# Patient Record
Sex: Male | Born: 1950 | Race: White | Hispanic: No | Marital: Married | Smoking: Former smoker
Health system: Southern US, Community
[De-identification: ages and names within clinical notes are randomized; demographics above are authoritative.]

---

## 2000-08-12 ENCOUNTER — Emergency Department (HOSPITAL_COMMUNITY): Admission: EM | Admit: 2000-08-12 | Discharge: 2000-08-13 | Payer: Self-pay | Admitting: Emergency Medicine

## 2002-11-16 ENCOUNTER — Encounter: Payer: Self-pay | Admitting: Orthopedic Surgery

## 2002-11-16 ENCOUNTER — Encounter: Admission: RE | Admit: 2002-11-16 | Discharge: 2002-11-16 | Payer: Self-pay | Admitting: Orthopedic Surgery

## 2003-05-25 ENCOUNTER — Ambulatory Visit (HOSPITAL_COMMUNITY): Admission: RE | Admit: 2003-05-25 | Discharge: 2003-05-25 | Payer: Self-pay | Admitting: Gastroenterology

## 2003-05-25 ENCOUNTER — Encounter (INDEPENDENT_AMBULATORY_CARE_PROVIDER_SITE_OTHER): Payer: Self-pay | Admitting: *Deleted

## 2004-02-06 ENCOUNTER — Encounter: Admission: RE | Admit: 2004-02-06 | Discharge: 2004-02-06 | Payer: Self-pay | Admitting: Orthopedic Surgery

## 2005-08-28 ENCOUNTER — Encounter: Admission: RE | Admit: 2005-08-28 | Discharge: 2005-08-28 | Payer: Self-pay | Admitting: Orthopedic Surgery

## 2005-09-15 ENCOUNTER — Encounter: Admission: RE | Admit: 2005-09-15 | Discharge: 2005-09-15 | Payer: Self-pay | Admitting: Pediatrics

## 2005-10-26 ENCOUNTER — Ambulatory Visit (HOSPITAL_BASED_OUTPATIENT_CLINIC_OR_DEPARTMENT_OTHER): Admission: RE | Admit: 2005-10-26 | Discharge: 2005-10-26 | Payer: Self-pay | Admitting: Orthopedic Surgery

## 2007-01-08 IMAGING — CT CT PARANASAL SINUSES LIMITED
1 series · 16 of 28 positions shown, 20 images · IV contrast (agent unspecified)
Comparison: none

CLINICAL DATA: Frontal and temporal pain, drainage, and pressure. 
 LIMITED CT SINUS W/O CONTRAST:
 Limited scans through the paranasal sinuses were performed in the prone coronal position.  The paranasal sinuses are well pneumatized with no present evidence of sinusitis.  The nasal turbinates are normal in size and position and the nasal airway is patent.  No bony abnormality is seen.

[Series 2: limited sinus · axial · 0.33mm/px · z∈[+24,+125]mm · 16 of 28 slices shown, 20 images]
[im 2/28  brain]
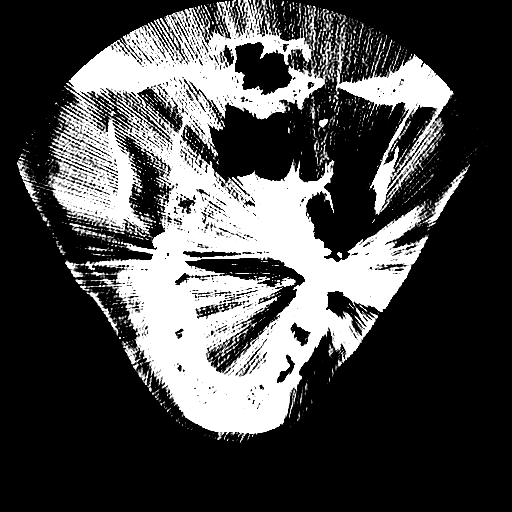
[im 2/28  bone]
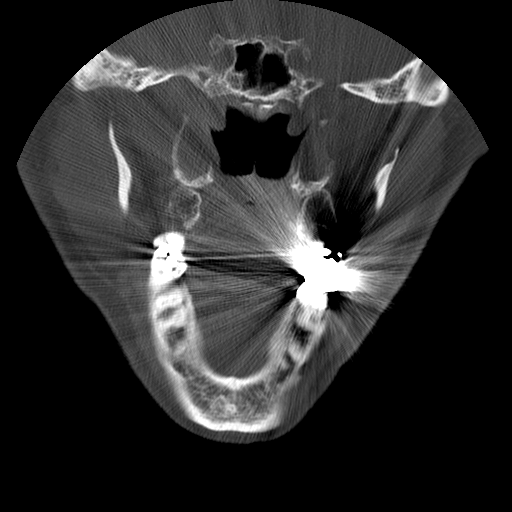
[im 4/28  bone]
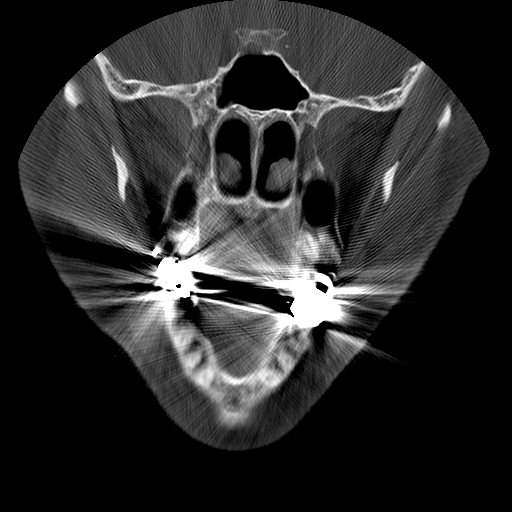
[im 6/28  bone]
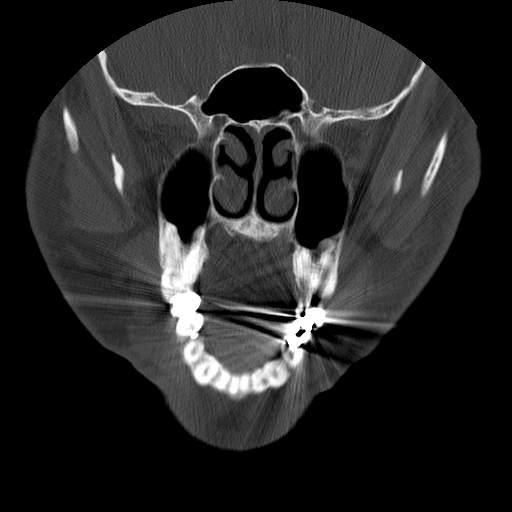
[im 7/28  bone]
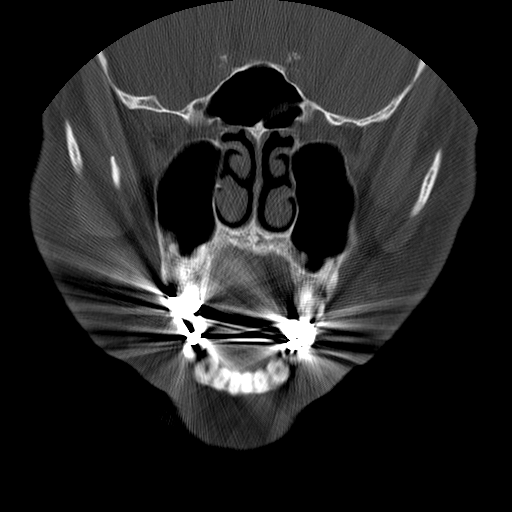
[im 9/28  brain]
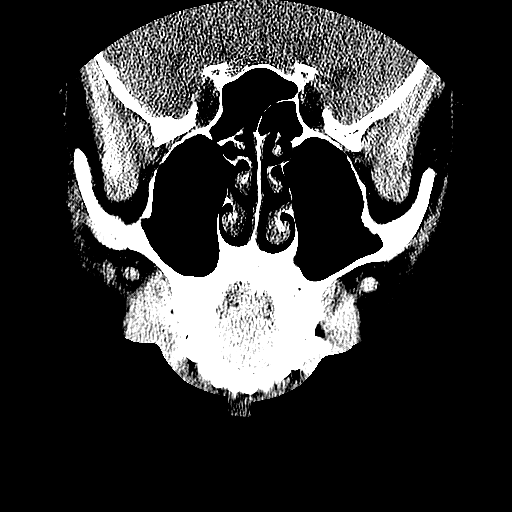
[im 9/28  bone]
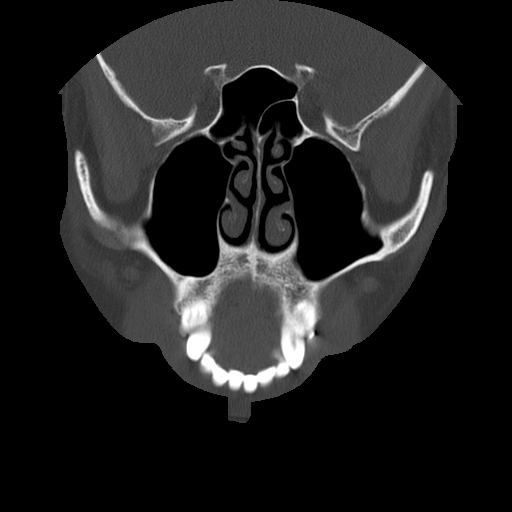
[im 10/28  bone]
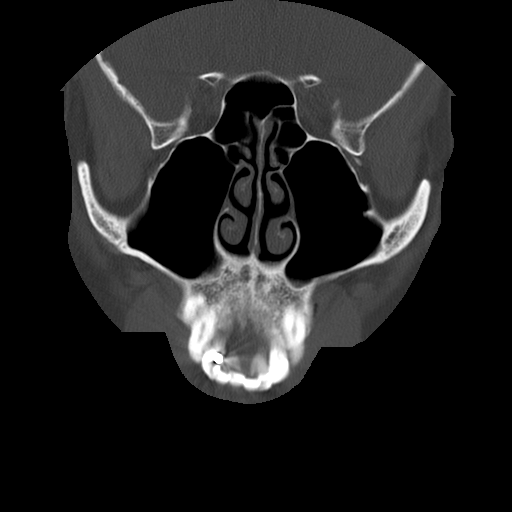
[im 12/28  bone]
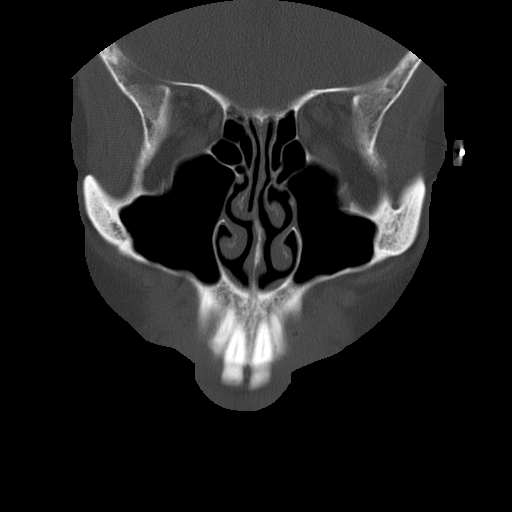
[im 14/28  bone]
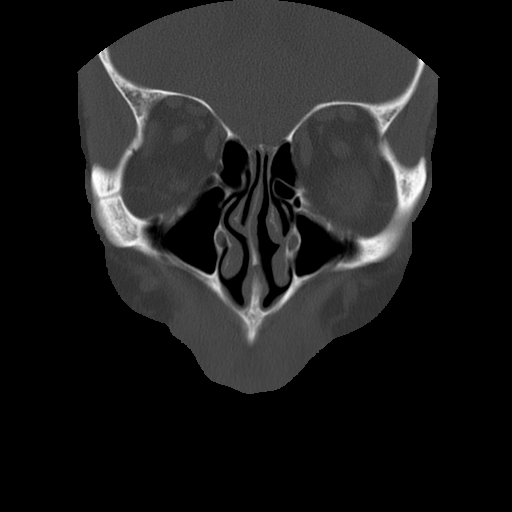
[im 15/28  brain]
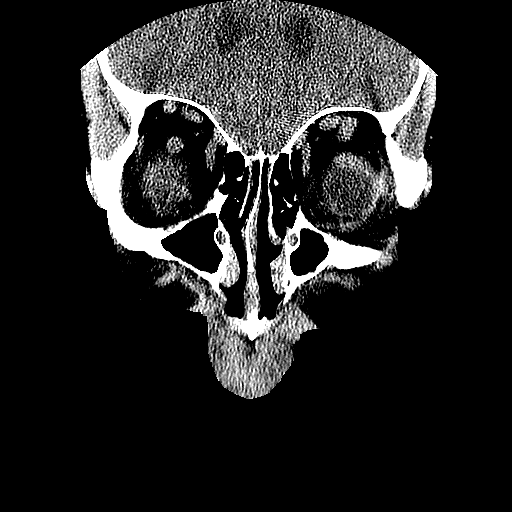
[im 15/28  bone]
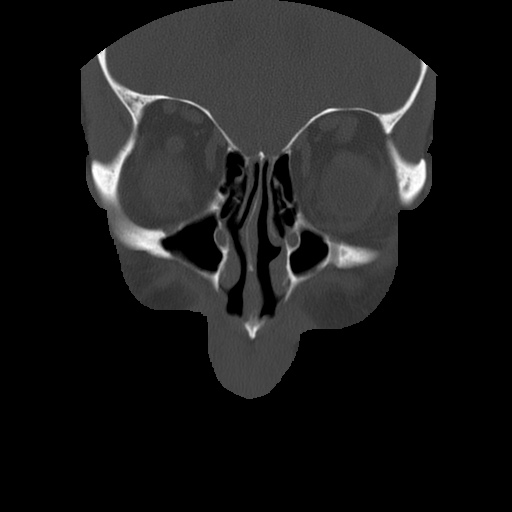
[im 17/28  bone]
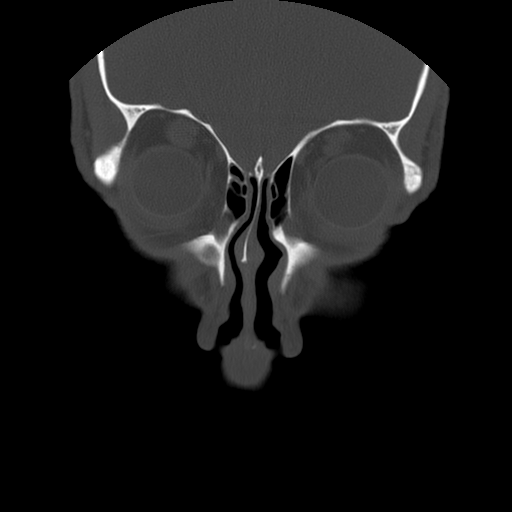
[im 19/28  bone]
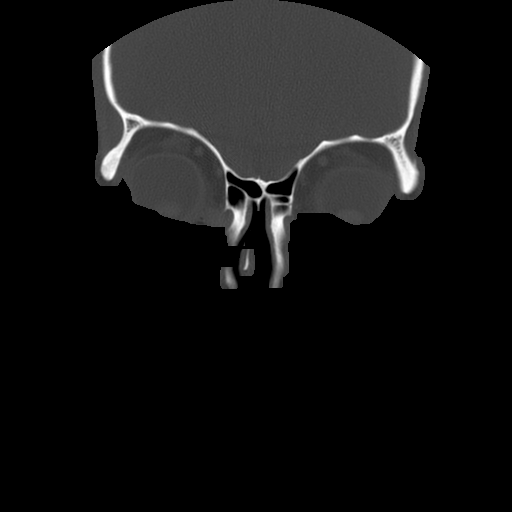
[im 20/28  bone]
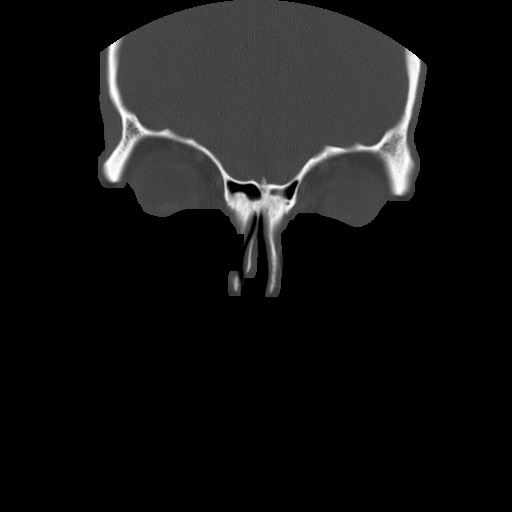
[im 22/28  brain]
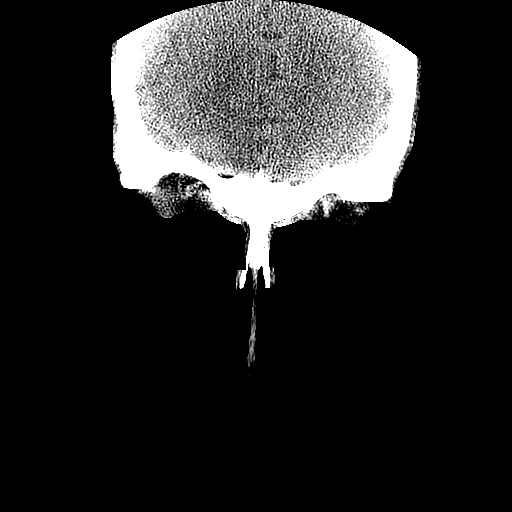
[im 22/28  bone]
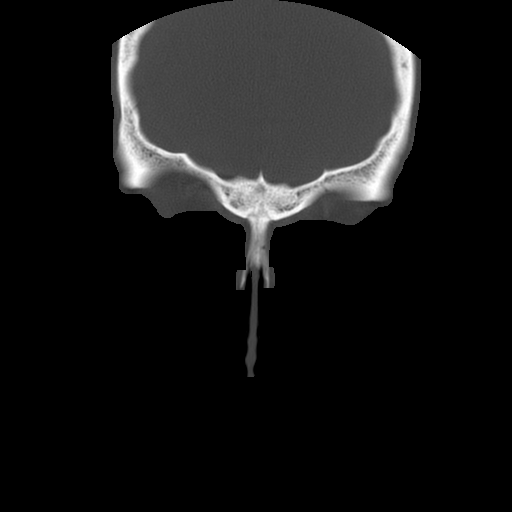
[im 23/28  bone]
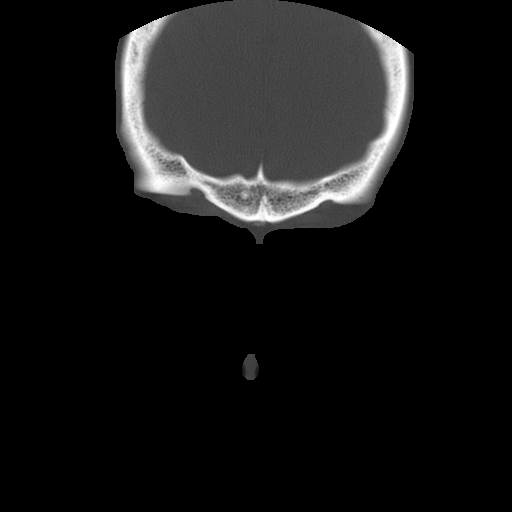
[im 25/28  bone]
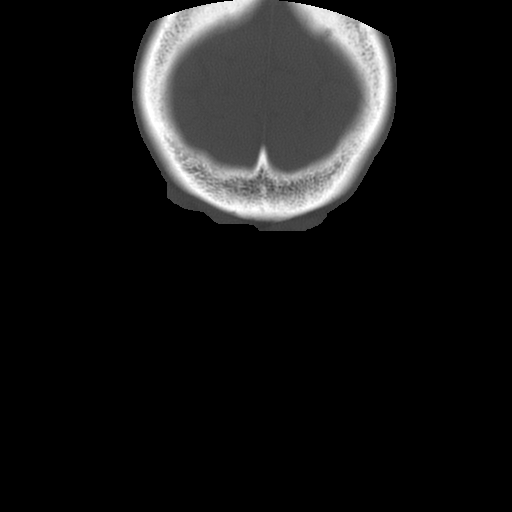
[im 27/28  bone]
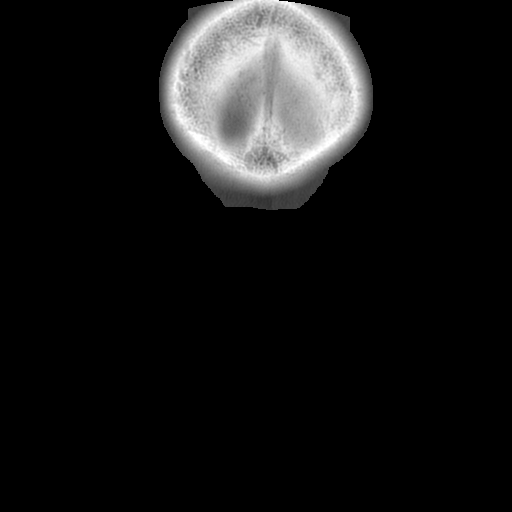

[16 of 28 positions shown; findings below may reference images not displayed]

IMPRESSION: No evidence of sinusitis.

## 2007-02-02 ENCOUNTER — Ambulatory Visit (HOSPITAL_BASED_OUTPATIENT_CLINIC_OR_DEPARTMENT_OTHER): Admission: RE | Admit: 2007-02-02 | Discharge: 2007-02-02 | Payer: Self-pay | Admitting: Pediatrics

## 2007-02-06 ENCOUNTER — Ambulatory Visit: Payer: Self-pay | Admitting: Internal Medicine

## 2007-04-14 ENCOUNTER — Ambulatory Visit: Payer: Self-pay | Admitting: Internal Medicine

## 2007-04-27 ENCOUNTER — Ambulatory Visit (HOSPITAL_COMMUNITY): Admission: RE | Admit: 2007-04-27 | Discharge: 2007-04-27 | Payer: Self-pay | Admitting: Internal Medicine

## 2007-04-27 ENCOUNTER — Ambulatory Visit: Payer: Self-pay | Admitting: Internal Medicine

## 2007-05-06 ENCOUNTER — Ambulatory Visit: Payer: Self-pay | Admitting: Internal Medicine

## 2007-06-14 ENCOUNTER — Ambulatory Visit: Payer: Self-pay | Admitting: Internal Medicine

## 2010-04-17 ENCOUNTER — Encounter: Payer: Self-pay | Admitting: Gastroenterology

## 2010-10-22 ENCOUNTER — Ambulatory Visit (HOSPITAL_COMMUNITY)
Admission: RE | Admit: 2010-10-22 | Discharge: 2010-10-22 | Payer: Self-pay | Source: Home / Self Care | Attending: Gastroenterology | Admitting: Gastroenterology

## 2010-11-04 NOTE — Letter (Signed)
Summary: New Patient letter  [facility]  [address] [place], [place] [address]   Phone: [phone]  Fax: [phone]       04/17/2010 MRN: [id number]  Brian Kent [address] [place], [place]  [address]  Dear Brian Kent,  Welcome to the Gastroenterology Division at [facility].    You are scheduled to see Dr.   Jarold Motto on 05-29-10 at [facility] on the 3rd floor at [facility], [address].  We ask that you try to arrive at our office 15 minutes prior to your appointment time to allow for check-in.  We would like you to complete the enclosed self-administered evaluation form prior to your visit and bring it with you on the day of your appointment.  We will review it with you.  Also, please bring a complete list of all your medications or, if you prefer, bring the medication bottles and we will list them.  Please bring your insurance card so that we may make a copy of it.  If your insurance requires a referral to see a specialist, please bring your referral form from your primary care physician.  Co-payments are due at the time of your visit and may be paid by cash, check or credit card.     Your office visit will consist of a consult with your physician (includes a physical exam), any laboratory testing he/she may order, scheduling of any necessary diagnostic testing (e.g. x-ray, ultrasound, CT-scan), and scheduling of a procedure (e.g. Endoscopy, Colonoscopy) if required.  Please allow enough time on your schedule to allow for any/all of these possibilities.    If you cannot keep your appointment, please call [phone] to cancel or reschedule prior to your appointment date.  This allows [place] the opportunity to schedule an appointment for another patient in need of care.  If you do not cancel or reschedule by 5 p.m. the business day prior to your appointment date, you will be charged a $50.00 late cancellation/no-show fee.    Thank you for choosing  [facility] for your medical needs.  We appreciate the opportunity to care for you.  Please visit [place] at our website  to learn more about our practice.                     Sincerely,                                                             The Gastroenterology Division

## 2011-02-17 NOTE — Assessment & Plan Note (Signed)
[facility]                             PULMONARY OFFICE NOTE   NAME:Brian Kent, Toren                     MRN:          [id number]  DATE:04/14/2007                            DOB:          09-26-1951    PULMONARY CONSULTATION   PROBLEM:  Pulmonary consultation for this 60 year old man at the kind  request of Dr. Beaulah Dinning with concerns of exertional dyspnea and sleep  apnea.   HISTORY:  Mr. Gupton is emphatic that his principal concern is with  daytime sleepiness, present constantly, but more apparent during  exertion.  He had been worked up by Dr. Beaulah Dinning saying that office  spirometries were normal.  He had allergen testing without vaccine  recommended.  He has been told he has asthma, but says that no pulmonary  medicines helped any, and the only medication that made any difference  was Ativan, which seemed to relieve his since of dyspnea.  A Cardiolite  study was normal.  The sense of daytime tiredness led to a nocturnal  polysomnogram on February 02, 2007, which showed normal sleep architecture  and mild obstructive sleep apnea with an index of 11.2 per hour, mild  snoring, and desaturation to 86%.  He also had mild periodic limb  movement with arousal at 2.4 per hour.  Occasionally, he is awakened  choking, and he suspects this may be partly an anxiety related to having  awakened once in the past with an anaphylactic reaction to ZANAFLEX.   MEDICATIONS:  1. Arthrotec 75 mg.  2. Allegra 180 mg.  3. Omeprazole 40 mg.  4. Nasonex.  Numerous medication and herbal supplements.   DRUG ALLERGIES:  ZANAFLEX with anaphylaxis.   REVIEW OF SYSTEMS:  Bedtime 10 to 11 p.m. estimating sleep latency 15  minutes and expecting to wake once or twice during the night before  final waking at 6 a.m.  Rare naps.  No irresistible daytime sleepiness.  Very occasional episodes of choking or gasping that wake him with little  snoring reported by wife.  Occasional  indigestion.  No chest pain or  palpitations, weight loss, or fever, sputum or wheeze, ankle edema or  leg pain.   PAST HISTORY:  Asthma.  Nasal congestion attributed to allergic  rhinitis.  Sleep apnea per HPI.  No surgeries.   SOCIAL HISTORY:  Contact allergy to ADHESIVE, questionably to LATEX.  No  problem with contrast dye or aspirin.  Quit smoking in 1980.  Married.   FAMILY HISTORY:  Father with emphysema.  Mother had allergic rhinitis  and asthma, and cancer.  Nobody in the family known to have sleep apnea.   OBJECTIVE:  Weight 243 pounds, BP 122/78, pulse 86, room air saturation  99%.  A very alert man giving the impression of mild apprehension or anxiety.  Nasal airway unobstructed.  Palate spacing 3/4 with posteriorly placed  thin soft palate.  Pharynx clear.  No stridor or thyromegaly.  Voice  quality normal.  CHEST:  Quite clear lung fields.  Unlabored breathing.  HEART:  Sounds are regular without murmur or gallop.  No enlargement of liver  or spleen.  EXTREMITIES:  Without cyanosis, clubbing, or edema, or tremor.   IMPRESSION:  1. Mild obstructive sleep apnea of doubtful connection to his concern      about a sense of dyspnea, especially during the day.  2. Tentative diagnosis of asthma, although he says he has had no      response to bronchodilator meds and spirometry scores were always      normal.  Note that he brings a note from his primary care      physician, Dr. Clelia Croft, asking about referral to Dr. Gala Romney for      cardiopulmonary stress test.  We discussed that, comparing it to      the Cardiolite study he had already had, and recognizing that his      personal concern is primarily with his sense of dyspnea with very      little associated objective finding.  I commented that relief by      Ativan could suggest this has a significant anxiety component or      occasionally could be explained by chest wall muscle tightness,      relaxed by Ativan.   PLAN:   1. We discussed CPAP, its medical concerns, and its treatment options.      We are going to titrate and try CPAP.  2. Schedule complete pulmonary function tests including measured lung      volumes and diffusion capacity with a 6-minute walk test.  3. Per suggestion of Dr. Clelia Croft, he is referred to cardiology, Dr.      Gala Romney for consideration of a cardiopulmonary stress test with      oxygen consumption evaluation because of complaint of dyspnea.      Schedule return in 2 months, earlier p.r.n.     Clinton D. Maple Hudson, MD, Tonny Bollman, FACP  Electronically Signed    CDY/MedQ  DD: 04/14/2007  DT: 04/15/2007  Job #: [id number]   cc:   Stephannie Li, M.D.  Kari Baars, M.D.  Francisca December, M.D.  Bevelyn Buckles. Bensimhon, MD

## 2011-02-17 NOTE — Assessment & Plan Note (Signed)
[facility]                             PULMONARY OFFICE NOTE   NAME:KARNESCastin, Donaghue                     MRN:          [id number]  DATE:06/14/2007                            DOB:          [birth year]    PROBLEM LIST:  1. Mild obstructive sleep apnea.  2. Dyspnea/asthma.   HISTORY OF PRESENT ILLNESS:  Sleep study had demonstrated an apnea  hypopnea index of 11 per hour.  Wife tells him he does not snore.  Cardiopulmonary exercise test in August has shown excellent  cardiovascular fitness with no obvious cardiopulmonary defect and  suggested his perception of exercise intolerance was due to his obesity  without evidence of exercise-induced bronchoconstriction.  Pulmonary  function testing August 1 had shown normal to supranormal values with  insignificant response to bronchodilator.  On a simple 6 minute walk  test he went 516 meters without stopping and maintained oxygen  saturation at 98 to 100% throughout.  Heart rate and blood pressure  response were normal.  He has no history of anemia.  He is still trying  CPAP, but has difficulty tolerating it.  We suggested that his apnea  scores were so low the CPAP was likely to be more disturbing than  helpful and that alternatives might suit him better.  We reviewed oral  appliances.   OBJECTIVE/PHYSICAL EXAMINATION:  VITAL SIGNS:  Weight 242 pounds, BP  120/70, pulse 75, room air saturation 100%.  NECK:  Neck veins distended 1 cm sitting.  CHEST:  Very clear.  He is somewhat overweight.  EXTREMITIES:  No edema.   IMPRESSION:  1. Complaint of dyspnea without objective basis by cardiopulmonary      stress test or standard pulmonary function test.  2. Obstructive sleep apnea not tolerating CPAP well, 11 per hour.   PLAN:  Weight loss.  Sleep hygiene education done.  Aerobic exercise  discussed with an emphasis on importance of losing weight.  We are  stopping his CPAP.  Contact information is given so  that he can look  into oral appliances for his sleep apnea.  Schedule return p.r.n.     Clinton D. Maple Hudson, MD, Tonny Bollman, FACP  Electronically Signed   CDY/MedQ  DD: 06/19/2007  DT: 06/20/2007  Job #: [id number]   cc:   Stephannie Li, M.D.  Kari Baars, M.D.  Francisca December, M.D.  Bevelyn Buckles. Bensimhon, MD

## 2011-02-20 NOTE — Procedures (Signed)
NAME:  Brian Kent, Brian Kent NO.:  [id number]   MEDICAL RECORD NO.:  [id number]          PATIENT TYPE:  OUT   LOCATION:  SLEEP CENTER                 FACILITY:  [facility]   PHYSICIAN:  Clinton D. Maple Hudson, MD, FCCP, FACPDATE OF BIRTH:  09[birth year]1950   DATE OF STUDY:  02/02/2007                            NOCTURNAL POLYSOMNOGRAM   REFERRING PHYSICIAN:  JOSE A BARDELAS   INDICATION FOR STUDY:  Hypersomnia with sleep apnea.   EPWORTH SLEEPINESS SCORE:  10/24, BMI 31.8, weight 235 pounds.   MEDICATIONS:  Home medications are listed and reviewed.   SLEEP ARCHITECTURE:  Total sleep time 376 minutes with sleep efficiency  93%.  Stage I was 4%, stage II 70%, stages III and IV 9%, REM 16% of  total sleep time.  Sleep latency 16 minutes, REM latency 73 minutes,  awake after sleep onset 16 minutes.  Arousal index 13.4.  No bedtime  medication was taken.   RESPIRATORY DATA:  Apnea/hypopnea index (AHI, RDI) 11.2 obstructive  events per hour indicating mild obstructive sleep apnea/hypopnea  syndrome.  There were 17 obstructive apneas and 53 hypopneas.  Events  were not positional.  REM AHI 3 per hour.  Diagnostic NPSG protocol was  requested and followed.   OXYGEN DATA:  Mild snoring with oxygen desaturation to a nadir of 86%.  Mean oxygen saturation through the study was 93% on room air.   CARDIAC DATA:  Sinus rhythm with occasional PVC.   MOVEMENT-PARASOMNIA:  A total of 27 limb jerks were recorded of which 15  were associated with arousal or awakening for a periodic limb movement  with arousal index of 2.4 per hour which is of doubtful significance.  No sudden events or choking were recorded.   IMPRESSIONS-RECOMMENDATIONS:  1. Unremarkable sleep architecture for age.  2. Mild obstructive sleep apnea/hypopnea syndrome, AHI 11.2 per hour      with nonpositional events.  Mild snoring and oxygen desaturation to      a nadir of 86%.  No choking episodes or acute respiratory  discomfort described.  3. Scores in this range may be appropriate for consideration of CPAP      titration as a therapeutic option if more      conservative measures are insufficient and symptoms are      sufficiently disruptive.  4. Mild periodic limb movement with arousal, 2.4 per hour, of doubtful      clinical significance.      Clinton D. Maple Hudson, MD, [facility], FACP  Diplomate, Biomedical engineer of Sleep Medicine  Electronically Signed     CDY/MEDQ  D:  02/06/2007 10:53:14  T:  02/06/2007 18:21:00  Job:  [id number]

## 2011-02-20 NOTE — Op Note (Signed)
NAME:  Brian Kent, Brian Kent              ACCOUNT NO.:  [id number]   MEDICAL RECORD NO.:  [id number]          PATIENT TYPE:  AMB   LOCATION:  DSC                          FACILITY:  [facility]   PHYSICIAN:  Robert A. Thurston Hole, M.D. DATE OF BIRTH:  04-25-1951   DATE OF PROCEDURE:  10/26/2005  DATE OF DISCHARGE:                                 OPERATIVE REPORT   PREOPERATIVE DIAGNOSIS:  Left knee medial and lateral meniscal tears with  chondromalacia and synovitis.   POSTOPERATIVE DIAGNOSIS:  Left knee medial and lateral meniscal tears with  chondromalacia and synovitis.   OPERATION PERFORMED:  1.  Left knee examination under anesthesia followed by arthroscopic partial      and lateral meniscectomies.  2.  Left knee chondroplasty with partial synovectomy.   SURGEON:  Elana Alm. Thurston Hole, M.D.   ANESTHESIA:  Local with MAC.   OPERATIVE TIME:  30 minutes.   COMPLICATIONS:  None.   INDICATIONS FOR PROCEDURE:  [name] is a 60 year old gentleman who has  had significant left knee pain for the past three months secondary to a  twisting injury.  Exam and MRI has revealed a medial and lateral meniscal  tear with chondromalacia and synovitis.  He has failed conservative care and  is now to undergo arthroscopy.   DESCRIPTION OF PROCEDURE:  [name] was brought to the operating room on  October 26, 2005 after a knee block was placed in the holding room by  Anesthesia. He was placed on the operating table in supine position.  His  left knee was examined under anesthesia. He had full range of motion and his  knee was stable to ligamentous exam with normal patellar tracking.  The left  leg was prepped using sterile DuraPrep and draped using sterile technique.  Originally through an anterolateral portal, the arthroscope with a pump  attached was placed through an anteromedial portal.  Arthroscopic probe was  placed.  On initial inspection of the medial compartment, the articular  cartilage showed 25%  grade 3 and the rest grade 1 to 2 chondromalacia which  was debrided, medial meniscus tear posteromedial horn of which 50% was  resected back to a stable rim. Intercondylar notch inspected.  The anterior  and posterior cruciate ligaments were normal.  The lateral compartment  inspected, 25% grade 3 chondromalacia which was debrided.  Lateral meniscal  tear, posterolateral and anterior horn of which 30 to 40% was resected back  to a stable rim.  Patellofemoral joint was inspected.  25 to 30% grade 3  chondromalacia which was debrided.  Patella tracked normally.  Medial and  lateral gutters showed moderate synovitis which was debrided.  Otherwise,  they were free of pathology.  After this was done, it was felt that all  pathology had been satisfactorily addressed.  The instruments were removed.  Portals were closed with 3-0 nylon suture and injected with 0.25% Marcaine  with epinephrine and 4 mg of morphine. Sterile dressings applied and the  patient awakened and taken to recovery room in stable condition.   FOLLOW-UP CARE:  [name] will be followed as an  outpatient on Vicodin and  Arthrotec.  See him back in the office in a week for suture removal and  follow-up.      Robert A. Thurston Hole, M.D.  Electronically Signed     RAW/MEDQ  D:  10/26/2005  T:  10/27/2005  Job:  [id number]

## 2014-07-16 ENCOUNTER — Other Ambulatory Visit: Payer: Self-pay | Admitting: Dermatology

## 2014-10-10 ENCOUNTER — Other Ambulatory Visit: Payer: Self-pay | Admitting: Gastroenterology

## 2014-10-10 DIAGNOSIS — R1013 Epigastric pain: Secondary | ICD-10-CM

## 2014-10-16 ENCOUNTER — Other Ambulatory Visit: Payer: Self-pay

## 2014-10-16 ENCOUNTER — Other Ambulatory Visit: Payer: Self-pay | Admitting: Gastroenterology

## 2014-10-18 ENCOUNTER — Ambulatory Visit
Admission: RE | Admit: 2014-10-18 | Discharge: 2014-10-18 | Disposition: A | Discharge status: Home / Self Care | Payer: Self-pay | Source: Ambulatory Visit | Attending: Gastroenterology | Admitting: Gastroenterology

## 2014-10-18 DIAGNOSIS — R1013 Epigastric pain: Secondary | ICD-10-CM

## 2015-04-01 ENCOUNTER — Other Ambulatory Visit: Payer: Self-pay

## 2018-01-17 ENCOUNTER — Encounter (HOSPITAL_COMMUNITY): Payer: Self-pay

## 2018-01-28 ENCOUNTER — Other Ambulatory Visit (HOSPITAL_COMMUNITY): Payer: Self-pay

## 2018-01-31 ENCOUNTER — Ambulatory Visit (HOSPITAL_COMMUNITY)
Admission: RE | Admit: 2018-01-31 | Discharge: 2018-01-31 | Disposition: A | Discharge status: Home / Self Care | Payer: Medicare Other | Source: Ambulatory Visit | Attending: Internal Medicine | Admitting: Internal Medicine

## 2018-01-31 DIAGNOSIS — M810 Age-related osteoporosis without current pathological fracture: Secondary | ICD-10-CM | POA: Insufficient documentation

## 2018-01-31 MED ORDER — ZOLEDRONIC ACID 5 MG/100ML IV SOLN
INTRAVENOUS | Status: AC
  Administered 2018-01-31: 5 mg via INTRAVENOUS
  Filled 2018-01-31: qty 100

## 2018-01-31 MED ORDER — ZOLEDRONIC ACID 5 MG/100ML IV SOLN
5.0000 mg | Freq: Once | INTRAVENOUS | Status: AC
  Administered 2018-01-31: 5 mg via INTRAVENOUS

## 2018-01-31 NOTE — Discharge Instructions (Signed)

## 2019-03-20 ENCOUNTER — Other Ambulatory Visit (HOSPITAL_COMMUNITY): Payer: Self-pay | Admitting: *Deleted

## 2019-03-21 ENCOUNTER — Other Ambulatory Visit: Payer: Self-pay

## 2019-03-21 ENCOUNTER — Ambulatory Visit (HOSPITAL_COMMUNITY)
Admission: RE | Admit: 2019-03-21 | Discharge: 2019-03-21 | Disposition: A | Discharge status: Home / Self Care | Payer: Medicare Other | Source: Ambulatory Visit | Attending: Internal Medicine | Admitting: Internal Medicine

## 2019-03-21 DIAGNOSIS — M810 Age-related osteoporosis without current pathological fracture: Secondary | ICD-10-CM | POA: Diagnosis not present

## 2019-03-21 MED ORDER — ZOLEDRONIC ACID 5 MG/100ML IV SOLN
INTRAVENOUS | Status: AC
  Administered 2019-03-21: 5 mg
  Filled 2019-03-21: qty 100

## 2019-03-21 MED ORDER — ZOLEDRONIC ACID 5 MG/100ML IV SOLN: 5.0000 mg | Freq: Once | INTRAVENOUS | Status: DC

## 2019-11-06 ENCOUNTER — Ambulatory Visit: Payer: Medicare Other

## 2019-11-12 ENCOUNTER — Ambulatory Visit: Payer: Medicare Other

## 2020-01-29 ENCOUNTER — Other Ambulatory Visit: Payer: Self-pay | Admitting: Anesthesiology

## 2020-01-29 DIAGNOSIS — M4316 Spondylolisthesis, lumbar region: Secondary | ICD-10-CM

## 2020-02-22 ENCOUNTER — Other Ambulatory Visit: Payer: Medicare Other

## 2020-02-26 ENCOUNTER — Ambulatory Visit
Admission: RE | Admit: 2020-02-26 | Discharge: 2020-02-26 | Disposition: A | Discharge status: Home / Self Care | Payer: Medicare Other | Source: Ambulatory Visit | Attending: Anesthesiology | Admitting: Anesthesiology

## 2020-02-26 ENCOUNTER — Other Ambulatory Visit: Payer: Self-pay

## 2020-02-26 DIAGNOSIS — M4316 Spondylolisthesis, lumbar region: Secondary | ICD-10-CM

## 2020-04-29 ENCOUNTER — Other Ambulatory Visit: Payer: Self-pay | Admitting: Podiatry

## 2020-04-29 ENCOUNTER — Other Ambulatory Visit: Payer: Self-pay

## 2020-04-29 ENCOUNTER — Ambulatory Visit (INDEPENDENT_AMBULATORY_CARE_PROVIDER_SITE_OTHER): Payer: Medicare Other

## 2020-04-29 ENCOUNTER — Ambulatory Visit (INDEPENDENT_AMBULATORY_CARE_PROVIDER_SITE_OTHER): Payer: Medicare Other | Admitting: Podiatry

## 2020-04-29 ENCOUNTER — Encounter: Payer: Self-pay | Admitting: Podiatry

## 2020-04-29 VITALS — Temp 97.3°F

## 2020-04-29 DIAGNOSIS — M21622 Bunionette of left foot: Secondary | ICD-10-CM | POA: Diagnosis not present

## 2020-04-29 DIAGNOSIS — L84 Corns and callosities: Secondary | ICD-10-CM | POA: Diagnosis not present

## 2020-04-29 DIAGNOSIS — M779 Enthesopathy, unspecified: Secondary | ICD-10-CM

## 2020-04-29 DIAGNOSIS — M21621 Bunionette of right foot: Secondary | ICD-10-CM | POA: Diagnosis not present

## 2020-04-29 NOTE — Progress Notes (Signed)
Patient ID: Brian Kent, male   DOB: [birth year], 69 y.o.   MRN: [id number]

## 2020-04-29 NOTE — Progress Notes (Signed)
Subjective:   Patient ID: Brian Kent, male   DOB: 69 y.o.   MRN: [id number]   HPI Patient presents stating he has had a lot of pain in the outside of his left foot and has been diagnosed with a tailor's bunion in the past.  States is very sore and makes it hard to walk and has tried Voltaren gel and different questions.  Patient does not smoke likes to be active   Review of Systems  All other systems reviewed and are negative.       Objective:  Physical Exam Vitals and nursing note reviewed.  Constitutional:      Appearance: He is well-developed.  Pulmonary:     Effort: Pulmonary effort is normal.  Musculoskeletal:        General: Normal range of motion.  Skin:    General: Skin is warm.  Neurological:     Mental Status: He is alert.     Neurovascular status intact muscle strength found to be adequate range of motion within normal limits.  Patient is noted to have plantar flexion of the fifth metatarsal left with fluid buildup and keratotic lesion that is painful when pressed with inability to walk comfortably on the outside of the left foot.  Patient has good digital perfusion well oriented x3     Assessment:  Inflammatory capsulitis fifth MPJ left with porokeratotic lesion painful when pressed     Plan:  H&P educated him on condition discussed possible surgical alternatives in future but at this point I went ahead and I did sterile prep and I injected around the fifth MPJ 3 mg dexamethasone Kenalog and I then went ahead debrided the lesion applied padding and advised on fifth metatarsal head resection if possible that he does not improve.  I also recommended continued gel usage and cushioning  X-rays indicate there is some prominence around the fifth metatarsal head left

## 2020-04-29 NOTE — Progress Notes (Signed)
° °  Subjective:    Patient ID: Brian Kent, male    DOB: 03/21/1951, 69 y.o.   MRN: [id number]  HPI    Review of Systems  All other systems reviewed and are negative.      Objective:   Physical Exam        Assessment & Plan:

## 2022-02-05 ENCOUNTER — Ambulatory Visit (INDEPENDENT_AMBULATORY_CARE_PROVIDER_SITE_OTHER): Payer: Medicare Other | Admitting: Podiatry

## 2022-02-05 ENCOUNTER — Encounter: Payer: Self-pay | Admitting: Podiatry

## 2022-02-05 DIAGNOSIS — M7752 Other enthesopathy of left foot: Secondary | ICD-10-CM

## 2022-02-05 MED ORDER — TRIAMCINOLONE ACETONIDE 10 MG/ML IJ SUSP
10.0000 mg | Freq: Once | INTRAMUSCULAR | Status: AC
  Administered 2022-02-05: 10 mg

## 2022-02-05 NOTE — Progress Notes (Signed)
Subjective:  ? ?Patient ID: Bland Span, male   DOB: 72 y.o.   MRN: [id number]  ? ?HPI ?Patient presents stating that the outside of the left foot has become sore again and makes it hard to walk with fluid buildup ? ? ?ROS ? ? ?   ?Objective:  ?Physical Exam  ?Neurovascular status intact with fluid around the fifth MPJ left with keratotic plantar lesion ? ?   ?Assessment:  ?Inflammatory capsulitis of the fifth MPJ left with lesion formation ? ?   ?Plan:  ?Sterile prep and injected the plantar fifth MPJ left 3 mg dexamethasone Kenalog 5 mg Xylocaine and debrided lesion advising that hopefully he will get another couple years of relief but if not we can become more aggressive in future if needed ? ?X-rays do indicate pressure on the fifth metatarsal head no other signs of pathology ?   ? ? ?

## 2022-03-16 ENCOUNTER — Encounter: Payer: Self-pay | Admitting: Podiatry

## 2022-03-16 ENCOUNTER — Ambulatory Visit (INDEPENDENT_AMBULATORY_CARE_PROVIDER_SITE_OTHER): Payer: Medicare Other | Admitting: Podiatry

## 2022-03-16 DIAGNOSIS — L84 Corns and callosities: Secondary | ICD-10-CM | POA: Diagnosis not present

## 2022-03-16 DIAGNOSIS — M21621 Bunionette of right foot: Secondary | ICD-10-CM

## 2022-03-16 DIAGNOSIS — M21622 Bunionette of left foot: Secondary | ICD-10-CM | POA: Diagnosis not present

## 2022-03-16 DIAGNOSIS — B351 Tinea unguium: Secondary | ICD-10-CM

## 2022-03-16 NOTE — Progress Notes (Signed)
Subjective:   Patient ID: Brian Kent, male   DOB: 71 y.o.   MRN: [id number]   HPI Patient presents stating he is having still a lot of problems around his fifth metatarsal and he is not sure what might need to be done long-term   ROS      Objective:  Physical Exam  Nerve vascular status intact with continued inflammation and pain around the fifth metatarsal head left with fluid buildup and prominence of the metatarsal head     Assessment:  Not responding so far conservatively to tailor's bunion treatment with inflammation fluid buildup and lesion     Plan:  H&P reviewed condition and the possibility for surgical intervention educated him on metatarsal head resection which may be possible.  Today I went ahead carefully debride a little bit more tissue and I applied a reverse dancers pad to offload the weight that I discussed shoe gear modification soaks and if symptoms come back quickly then surgical intervention will most likely be necessary

## 2023-03-15 ENCOUNTER — Ambulatory Visit (INDEPENDENT_AMBULATORY_CARE_PROVIDER_SITE_OTHER): Payer: Medicare Other | Admitting: Podiatry

## 2023-03-15 ENCOUNTER — Encounter: Payer: Self-pay | Admitting: Podiatry

## 2023-03-15 DIAGNOSIS — M21621 Bunionette of right foot: Secondary | ICD-10-CM

## 2023-03-15 DIAGNOSIS — M7752 Other enthesopathy of left foot: Secondary | ICD-10-CM | POA: Diagnosis not present

## 2023-03-15 DIAGNOSIS — M21622 Bunionette of left foot: Secondary | ICD-10-CM

## 2023-03-15 MED ORDER — TRIAMCINOLONE ACETONIDE 10 MG/ML IJ SUSP
10.0000 mg | Freq: Once | INTRAMUSCULAR | Status: AC
  Administered 2023-03-15: 10 mg

## 2023-03-15 NOTE — Progress Notes (Signed)
Subjective:   Patient ID: Brian Kent, male   DOB: 72 y.o.   MRN: [id number]   HPI Patient presents with a lot of pain in the plantar of the left fifth metatarsal head with fluid buildup around the joint surface and lesion formation.  States it started getting sore in the last month   ROS      Objective:  Physical Exam  Inflammatory capsulitis fifth MPJ left fluid buildup lesion formation with pain     Assessment:  Capsulitis with keratotic tissue formation fifth MPJ left with fluid buildup     Plan:  Reviewed condition sterile prep injected the plantar capsule left 3 mg dexamethasone Kenalog 5 mg Xylocaine tolerated well and advised on good shoe gear and reappoint as needed

## 2024-02-22 ENCOUNTER — Ambulatory Visit: Admitting: Podiatry

## 2024-02-23 ENCOUNTER — Ambulatory Visit (INDEPENDENT_AMBULATORY_CARE_PROVIDER_SITE_OTHER): Admitting: Podiatry

## 2024-02-23 ENCOUNTER — Ambulatory Visit (INDEPENDENT_AMBULATORY_CARE_PROVIDER_SITE_OTHER)

## 2024-02-23 ENCOUNTER — Encounter: Payer: Self-pay | Admitting: Podiatry

## 2024-02-23 VITALS — Ht 72.0 in | Wt 205.0 lb

## 2024-02-23 DIAGNOSIS — M7752 Other enthesopathy of left foot: Secondary | ICD-10-CM

## 2024-02-23 MED ORDER — TRIAMCINOLONE ACETONIDE 10 MG/ML IJ SUSP
10.0000 mg | Freq: Once | INTRAMUSCULAR | Status: AC
  Administered 2024-02-23: 10 mg via INTRA_ARTICULAR

## 2024-02-24 NOTE — Progress Notes (Signed)
 Subjective:   Patient ID: Brian Kent, male   DOB: 73 y.o.   MRN: [id number]   HPI Patient presents with pain around the fifth metatarsal head left and states that there is also a lesion associated with it neuro   ROS      Objective:  Physical Exam  Vascular status intact inflammation fluid around the fifth MPJ left with lesion formation lucent core     Assessment:  Inflammatory capsulitis fifth MPJ left with lesion formation     Plan:  Sterile prep went ahead and injected the fifth MPJ 3 mg dexamethasone Kenalog  5 mg Xylocaine to reduce the inflammatory complex and then debrided lesion will be seen back as needed may require surgery eventually

## 2024-04-21 ENCOUNTER — Encounter: Payer: Self-pay | Admitting: Advanced Practice Midwife

## 2024-06-21 ENCOUNTER — Other Ambulatory Visit: Payer: Self-pay

## 2024-06-21 ENCOUNTER — Emergency Department (HOSPITAL_BASED_OUTPATIENT_CLINIC_OR_DEPARTMENT_OTHER)
Admission: EM | Admit: 2024-06-21 | Discharge: 2024-06-21 | Discharge status: Against Medical Advice | Attending: Emergency Medicine | Admitting: Emergency Medicine

## 2024-06-21 DIAGNOSIS — R42 Dizziness and giddiness: Secondary | ICD-10-CM | POA: Diagnosis present

## 2024-06-21 DIAGNOSIS — Z5321 Procedure and treatment not carried out due to patient leaving prior to being seen by health care provider: Secondary | ICD-10-CM | POA: Diagnosis not present

## 2024-06-21 LAB — COMPREHENSIVE METABOLIC PANEL WITH GFR
ALT: 20 U/L (ref 0–44)
AST: 28 U/L (ref 15–41)
Albumin: 4.5 g/dL (ref 3.5–5.0)
Alkaline Phosphatase: 69 U/L (ref 38–126)
Anion gap: 12 (ref 5–15)
BUN: 15 mg/dL (ref 8–23)
CO2: 23 mmol/L (ref 22–32)
Calcium: 9.6 mg/dL (ref 8.9–10.3)
Chloride: 102 mmol/L (ref 98–111)
Creatinine, Ser: 0.76 mg/dL (ref 0.61–1.24)
GFR, Estimated: >60 mL/min (ref >60)
Glucose, Bld: 96 mg/dL (ref 70–99)
Potassium: 4 mmol/L (ref 3.5–5.1)
Sodium: 137 mmol/L (ref 135–145)
Total Bilirubin: 0.4 mg/dL (ref 0.0–1.2)
Total Protein: 6.9 g/dL (ref 6.5–8.1)

## 2024-06-21 LAB — CBC
HCT: 42.9 % (ref 39.0–52.0)
Hemoglobin: 15.1 g/dL (ref 13.0–17.0)
MCH: 32.1 pg (ref 26.0–34.0)
MCHC: 35.2 g/dL (ref 30.0–36.0)
MCV: 91.3 fL (ref 80.0–100.0)
Platelets: 156 K/uL (ref 150–400)
RBC: 4.7 MIL/uL (ref 4.22–5.81)
RDW: 12.2 % (ref 11.5–15.5)
WBC: 4.9 K/uL (ref 4.0–10.5)
nRBC: 0 % (ref 0.0–0.2)

## 2024-06-21 NOTE — ED Triage Notes (Signed)
 Pt POV reporting lightheadedness since noon, watch showing afib, no hx, rate 100s, denies chest pain.

## 2024-07-04 ENCOUNTER — Ambulatory Visit (HOSPITAL_COMMUNITY)
Admission: RE | Admit: 2024-07-04 | Discharge: 2024-07-04 | Disposition: A | Discharge status: Home / Self Care | Source: Ambulatory Visit | Attending: Internal Medicine | Admitting: Internal Medicine

## 2024-07-04 ENCOUNTER — Encounter (HOSPITAL_COMMUNITY): Payer: Self-pay | Admitting: Internal Medicine

## 2024-07-04 VITALS — BP 124/84 | HR 65 | Ht 72.0 in | Wt 191.4 lb

## 2024-07-04 DIAGNOSIS — I4891 Unspecified atrial fibrillation: Secondary | ICD-10-CM | POA: Diagnosis not present

## 2024-07-04 DIAGNOSIS — I480 Paroxysmal atrial fibrillation: Secondary | ICD-10-CM

## 2024-07-04 NOTE — Patient Instructions (Signed)
 Echocardiogram -- scheduling will call once insurance authorization received.

## 2024-07-04 NOTE — Progress Notes (Signed)
 Primary Care Physician: Brian Elsie JONETTA Mickey., MD Primary Cardiologist: None Electrophysiologist: None     Referring Physician: Dr. Loreli Wellington [name] is a 73 y.o. male with a history of HLD, OSA intolerant of CPAP, GERD, history of tobacco use, osteopenia, atrial fibrillation who presents for consultation in the [facility]. Patient went to ED on 9/17 noting Afib on Kardiam[employer] device; he did not stay because he appeared to convert to NSR while waiting. Strips reviewed by PCP appear to be consistent with Afib. Patient started on ASA 81 mg daily and given Lopressor 25 mg BID PRN. Patient has a CHADS2VASC score of 1.  On evaluation today, patient is currently in NSR. Patient notes he was taking sudafed consistently for 2-3 weeks and felt/noted elevated HR with his watch. He stopped and then restarted sudafed taking it daily to try and help with his congestion. He then noted he went into Afib on 9/17 and was in it for majority of the day. He brings several ECG strips that demonstrate Afib with RVR; HR 150 at 1345, HR 118 at 1551, and HR 104 at 1658. He stopped sudafed and has not had any episodes of Afib since then. He uses a watch to monitor his rhythm. He notes a history of borderline sleep apnea several years ago but could not tolerate mask; he also lost about 60 lbs since time of diagnosis.   Today, he denies symptoms of chest pain, shortness of breath, orthopnea, PND, lower extremity edema, dizziness, presyncope, syncope, bleeding, or neurologic sequela. The patient is tolerating medications without difficulties and is otherwise without complaint today.   he has a BMI of Body mass index is 25.96 kg/m.Brian Kent Filed Weights   07/04/24 1359  Weight: 86.8 kg    Current Outpatient Medications  Medication Sig Dispense Refill   albuterol (VENTOLIN HFA) 108 (90 Base) MCG/ACT inhaler SMARTSIG:2 Puff(s) By Mouth Every 6 Hours PRN     aspirin EC 81 MG tablet Take 81  mg by mouth daily.     atorvastatin (LIPITOR) 10 MG tablet Take 10 mg by mouth daily.     celecoxib (CELEBREX) 200 MG capsule Take 200 mg by mouth daily.     Coenzyme Q10 (Q-SORB CO Q-10) 100 MG capsule Take 100 mg by mouth daily.     diazepam (VALIUM) 10 MG tablet Take 10 mg by mouth daily as needed.     EPINEPHrine 0.3 mg/0.3 mL IJ SOAJ injection Inject 0.3 mg into the muscle as needed for anaphylaxis.     fexofenadine (ALLEGRA) 180 MG tablet Take 180 mg by mouth daily.     fluticasone (FLONASE) 50 MCG/ACT nasal spray Place 2 sprays into both nostrils daily.     gabapentin (NEURONTIN) 300 MG capsule Take 300 mg by mouth 3 (three) times daily.     ketoconazole (NIZORAL) 2 % cream Apply topically daily.     lidocaine (LIDODERM) 5 % 1 patch daily.     meclizine (ANTIVERT) 25 MG tablet Take 25 mg by mouth 3 (three) times daily as needed for dizziness.     metoprolol tartrate (LOPRESSOR) 25 MG tablet Take 25 mg by mouth 2 (two) times daily as needed (HR).     traMADol (ULTRAM) 50 MG tablet Take 50 mg by mouth every 6 (six) hours as needed.     No current facility-administered medications for this encounter.    Atrial Fibrillation Management history:  Previous antiarrhythmic drugs: none Previous  cardioversions: none Previous ablations: none Anticoagulation history: none   ROS- All systems are reviewed and negative except as per the HPI above.  Physical Exam: BP 124/84   Pulse 65   Ht 6' (1.829 m)   Wt 86.8 kg   BMI 25.96 kg/m   GEN: Well nourished, well developed in no acute distress NECK: No JVD; No carotid bruits CARDIAC: Regular rate and rhythm, no murmurs, rubs, gallops RESPIRATORY:  Clear to auscultation without rales, wheezing or rhonchi  ABDOMEN: Soft, non-tender, non-distended EXTREMITIES:  No edema; No deformity   EKG today demonstrates  Vent. rate 65 BPM PR interval 218 ms QRS duration 86 ms QT/QTcB 380/395 ms P-R-T axes 78 67 55 Sinus rhythm with 1st degree A-V  block Otherwise normal ECG When compared with ECG of 21-Jun-2024 18:18, No significant change was found  Echo N/A  ASSESSMENT & PLAN CHA2DS2-VASc Score = 1  The patient's score is based upon: CHF History: 0 HTN History: 0 Diabetes History: 0 Stroke History: 0 Vascular Disease History: 0 Age Score: 1 Gender Score: 0       ASSESSMENT AND PLAN: Paroxysmal Atrial Fibrillation (ICD10:  I48.0) The patient's CHA2DS2-VASc score is 1, indicating a 0.6% annual risk of stroke.    Patient is currently in NSR. Education provided about Afib. Discussion about triggers for Afib. After discussion, we will proceed with conservative observation at this time. Will order baseline echocardiogram. Patient defers sleep study at this time. Continue using watch. He has Lopressor PRN given by PCP. From an Afib standpoint, I do not have indication for patient to continue ASA. Defer to PCP if wishes to continue this. We discussed the reasoning behind anticoagulation in the setting of stroke prevention related to Afib. We discussed his low risk score at this time but if he has more Afib after his [birth year] on [birth year] he would meet criteria for anticoagulation. Patient understands this and we will continue current regimen without change. He does not wish follow up at this time.     Follow up Afib clinic prn.   Brian Kent, [facility]  [facility] [address] [place], [place] [address] [phone]

## 2024-07-06 ENCOUNTER — Ambulatory Visit (HOSPITAL_COMMUNITY): Admitting: Internal Medicine

## 2024-07-06 ENCOUNTER — Other Ambulatory Visit (HOSPITAL_COMMUNITY): Payer: Self-pay

## 2024-07-06 DIAGNOSIS — I480 Paroxysmal atrial fibrillation: Secondary | ICD-10-CM

## 2024-08-02 ENCOUNTER — Telehealth (HOSPITAL_COMMUNITY): Payer: Self-pay | Admitting: *Deleted

## 2024-08-02 NOTE — Telephone Encounter (Signed)
 pt called yesterday evening stating he had an episode of af that lasted about 12 hours HRs from 90-140s took 2 different doses of PRN metoprolol before it finally settled. He wanted to make sure no change in plan with this episode that was different that past. he does report he's been under a lot of stress related to a death in family and wonders if this may have trigger the episode. HR when in NSR upper 50s. Per Thom Heinrich PA no changes at this time but if burden increases pt instructed to contact office for reassessment.

## 2024-08-04 ENCOUNTER — Other Ambulatory Visit (HOSPITAL_COMMUNITY)

## 2024-09-01 ENCOUNTER — Ambulatory Visit (HOSPITAL_COMMUNITY)

## 2024-09-11 ENCOUNTER — Ambulatory Visit (HOSPITAL_COMMUNITY)
Admission: RE | Admit: 2024-09-11 | Discharge: 2024-09-11 | Disposition: A | Source: Ambulatory Visit | Attending: Internal Medicine | Admitting: Internal Medicine

## 2024-09-11 DIAGNOSIS — I480 Paroxysmal atrial fibrillation: Secondary | ICD-10-CM

## 2024-09-11 LAB — ECHOCARDIOGRAM COMPLETE
Area-P 1/2: 3.83 cm2
S' Lateral: 2.5 cm

## 2024-09-12 ENCOUNTER — Other Ambulatory Visit (HOSPITAL_COMMUNITY): Payer: Self-pay | Admitting: *Deleted

## 2024-09-12 ENCOUNTER — Ambulatory Visit (HOSPITAL_COMMUNITY): Payer: Self-pay | Admitting: Internal Medicine

## 2024-09-12 MED ORDER — METOPROLOL TARTRATE 25 MG PO TABS: 12.5000 mg | ORAL_TABLET | Freq: Two times a day (BID) | ORAL | 3 refills | Status: DC

## 2024-09-13 ENCOUNTER — Telehealth (HOSPITAL_COMMUNITY): Payer: Self-pay | Admitting: *Deleted

## 2024-09-13 NOTE — Telephone Encounter (Signed)
 Pt called and reported more frequent episodes of PVC's that he can feel. I discussed with J.Suarez P.A. recommends to start Lopressor  12.5 mg bid. Pt understands and agrees with plan he will monitor HR and BP and call us  in a week for an update.

## 2024-09-14 ENCOUNTER — Ambulatory Visit: Admitting: Podiatry

## 2024-09-14 ENCOUNTER — Encounter: Payer: Self-pay | Admitting: Podiatry

## 2024-09-14 VITALS — Ht 72.0 in | Wt 191.4 lb

## 2024-09-14 DIAGNOSIS — M7752 Other enthesopathy of left foot: Secondary | ICD-10-CM | POA: Diagnosis not present

## 2024-09-14 MED ORDER — TRIAMCINOLONE ACETONIDE 10 MG/ML IJ SUSP
10.0000 mg | Freq: Once | INTRAMUSCULAR | Status: AC
  Administered 2024-09-14: 10 mg via INTRA_ARTICULAR

## 2024-09-14 NOTE — Progress Notes (Signed)
 Subjective:   Patient ID: Brian Kent, male   DOB: 73 y.o.   MRN: [id number]   HPI The patient presents with inflammation fluid around the fifth MPJ left tender when pressed and states he only got a few months relief this last time   ROS      Objective:  Physical Exam  Inflammatory capsulitis fifth MPJ left fluid buildup pain with secondary lesion     Assessment:  Inflammatory condition with porokeratotic lesion that did not respond to the last treatment to the degree I was hoping     Plan:  H&P discussed if it reoccurs or get a need to do surgery on this and I did explain fifth metatarsal head resection but at this time organ to try conservative again I did sterile prep injected the capsule 3 mg dexamethasone Kenalog  5 mg Xylocaine debrided the lesion courtesy applied sterile dressing

## 2024-10-27 ENCOUNTER — Telehealth (HOSPITAL_COMMUNITY): Payer: Self-pay | Admitting: *Deleted

## 2024-10-27 ENCOUNTER — Other Ambulatory Visit (HOSPITAL_COMMUNITY): Payer: Self-pay | Admitting: *Deleted

## 2024-10-27 MED ORDER — DILTIAZEM HCL ER COATED BEADS 120 MG PO CP24: 120.0000 mg | ORAL_CAPSULE | Freq: Every day | ORAL | 3 refills | Status: DC

## 2024-10-27 NOTE — Telephone Encounter (Signed)
 Patient called in stating once he started on daily metoprolol  tartrate he noticed a raised rash on his trunk and back. He was hopeful this would subside but the rash/itching has continued.  Discussed with Thom Heinrich PA will stop metoprolol  and start cardizem 120mg  once a day.  Pt verbalized understanding of recommendations.

## 2024-11-03 ENCOUNTER — Telehealth (HOSPITAL_COMMUNITY): Payer: Self-pay | Admitting: *Deleted

## 2024-11-03 MED ORDER — DILTIAZEM HCL ER COATED BEADS 180 MG PO CP24: 180.0000 mg | ORAL_CAPSULE | Freq: Every day | ORAL | 3 refills | Status: AC

## 2024-11-03 NOTE — Telephone Encounter (Signed)
 Patient called in stating since changing to diltiazem  his rash has disappeared but he does notice more palpitations since the transition.  HR 68-83. Discussed with Daril Kicks PA will increase cardizem  to 180mg  once a day. Follow up 1 month. Pt in agreement.

## 2024-11-30 ENCOUNTER — Ambulatory Visit (HOSPITAL_COMMUNITY): Admitting: Internal Medicine
